# Patient Record
Sex: Female | Born: 1980 | Race: Black or African American | Hispanic: No | Marital: Single | Smoking: Never smoker
Health system: Southern US, Community
[De-identification: ages and names within clinical notes are randomized; demographics above are authoritative.]

---

## 2004-09-05 ENCOUNTER — Inpatient Hospital Stay (HOSPITAL_COMMUNITY): Admission: AD | Admit: 2004-09-05 | Discharge: 2004-09-07 | Payer: Self-pay | Admitting: *Deleted

## 2004-09-05 ENCOUNTER — Ambulatory Visit: Payer: Self-pay | Admitting: Family Medicine

## 2004-09-29 ENCOUNTER — Ambulatory Visit (HOSPITAL_COMMUNITY): Admission: RE | Admit: 2004-09-29 | Discharge: 2004-09-29 | Payer: Self-pay | Admitting: *Deleted

## 2005-02-22 ENCOUNTER — Inpatient Hospital Stay (HOSPITAL_COMMUNITY): Admission: AD | Admit: 2005-02-22 | Discharge: 2005-02-22 | Payer: Self-pay | Admitting: Obstetrics

## 2005-02-26 ENCOUNTER — Inpatient Hospital Stay (HOSPITAL_COMMUNITY): Admission: AD | Admit: 2005-02-26 | Discharge: 2005-02-28 | Payer: Self-pay | Admitting: Internal Medicine

## 2005-05-01 ENCOUNTER — Emergency Department (HOSPITAL_COMMUNITY): Admission: EM | Admit: 2005-05-01 | Discharge: 2005-05-01 | Payer: Self-pay | Admitting: Family Medicine

## 2005-08-10 IMAGING — US US RETROPERITONEAL COMPLETE
1 series · 18 of 25 positions shown · non-contrast
Comparison: None.

CLINICAL DATA: Fourteen and one/half weeks pregnant patient complains of abdominal pain.  Evaluate for renal stones.
 RENAL SONOGRAM:

[Series 1: us renal/aorta · 18 of 32 slices shown]
[im 1/32]
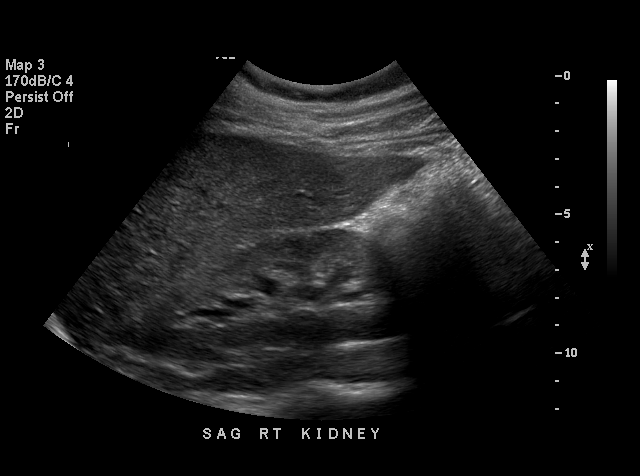
[im 3/32]
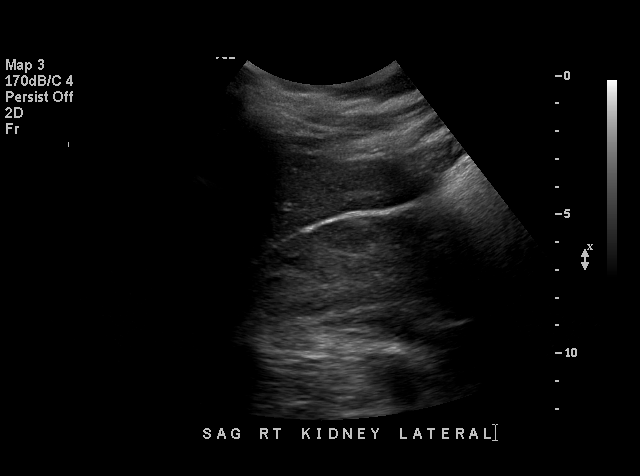
[im 4/32]
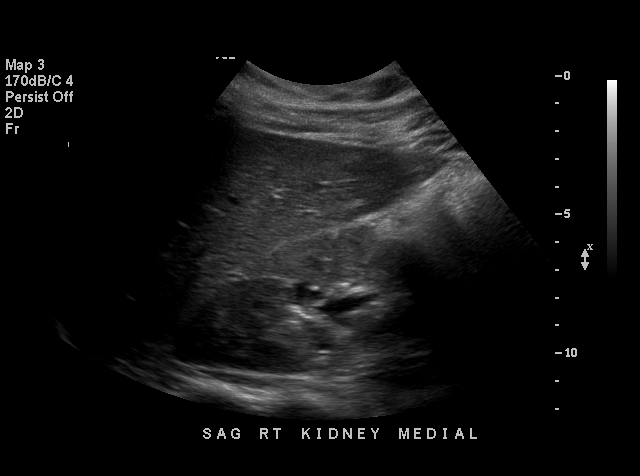
[im 6/32]
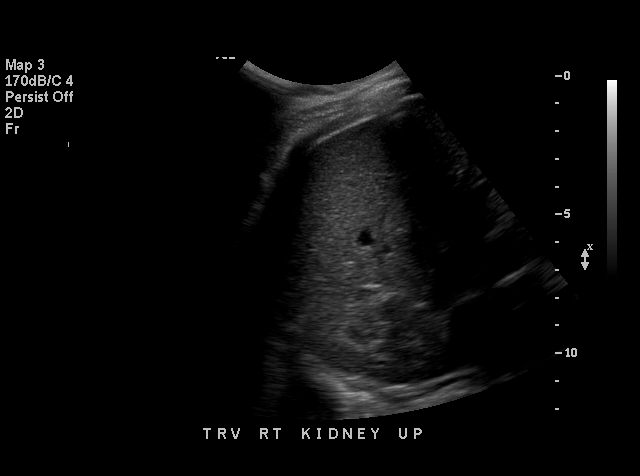
[im 8/32]
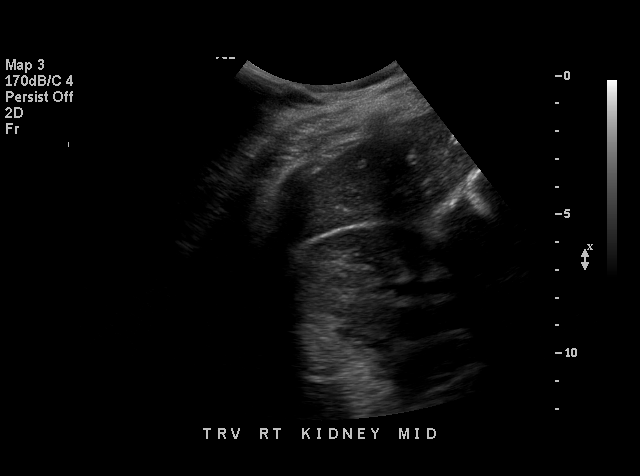
[im 10/32]
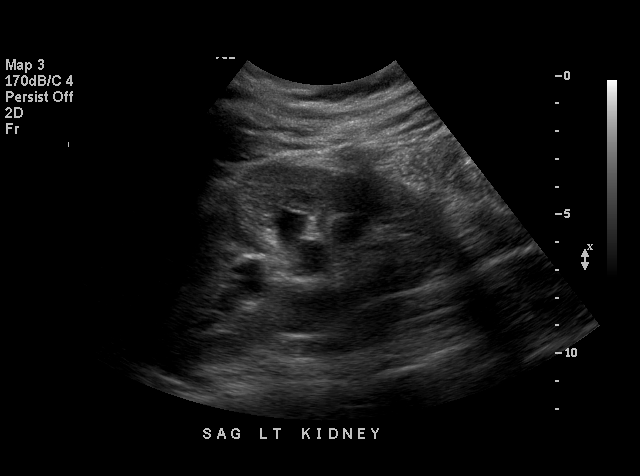
[im 12/32]
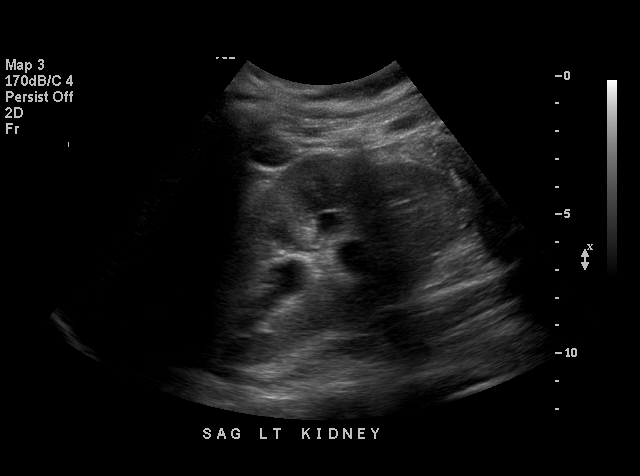
[im 13/32]
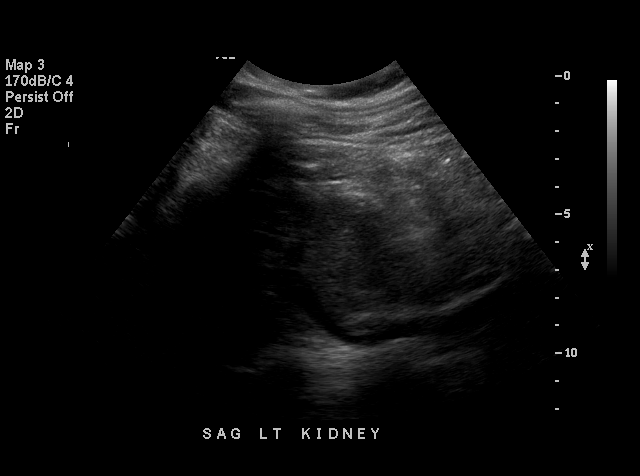
[im 15/32]
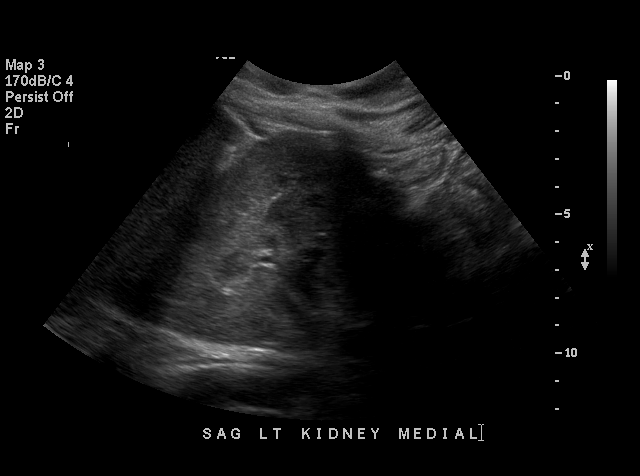
[im 17/32]
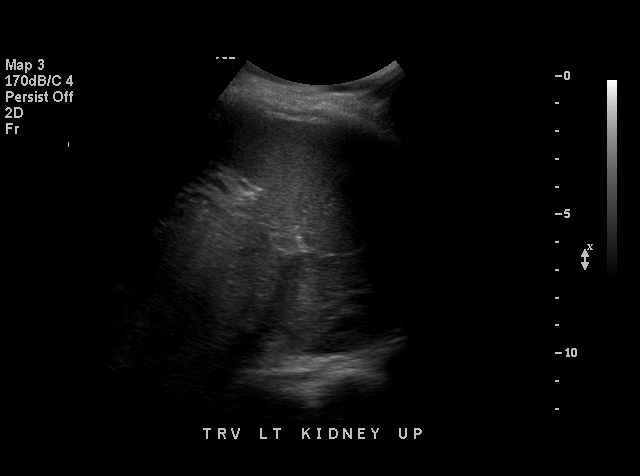
[im 19/32]
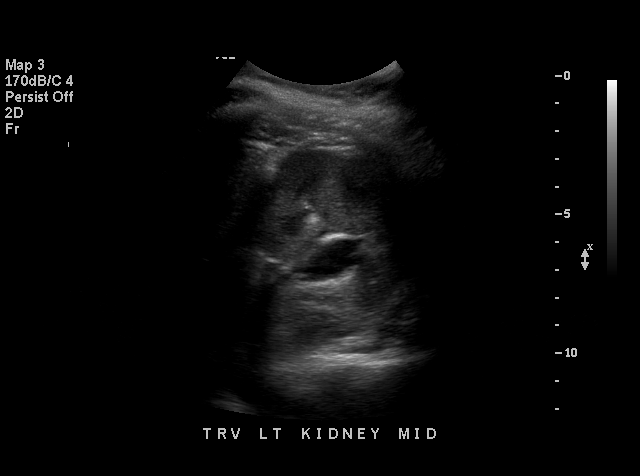
[im 20/32]
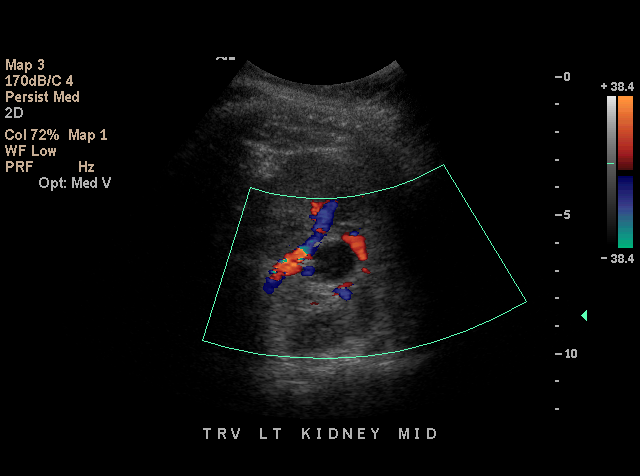
[im 22/32]
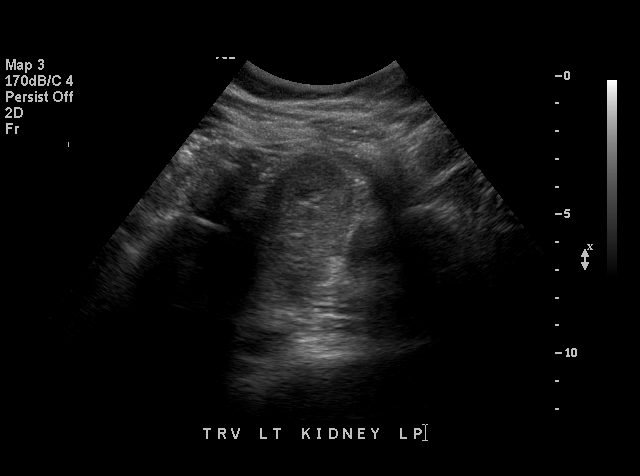
[im 24/32]
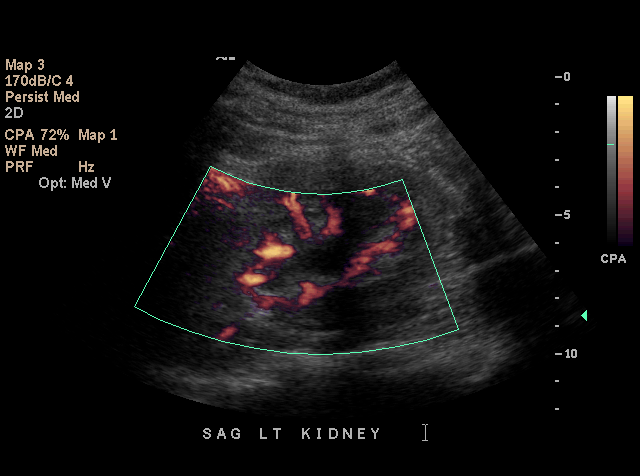
[im 26/32]
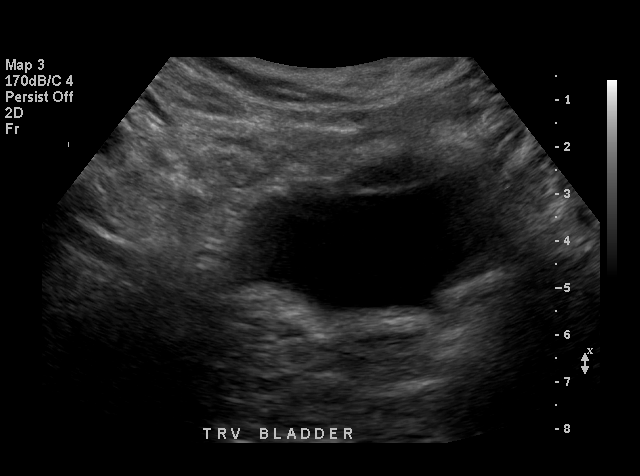
[im 28/32]
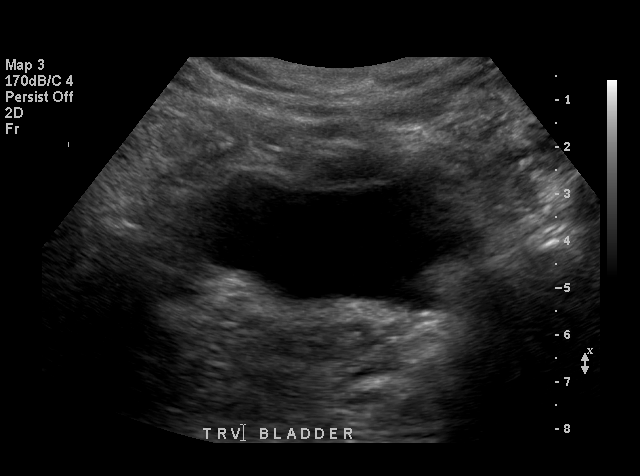
[im 29/32]
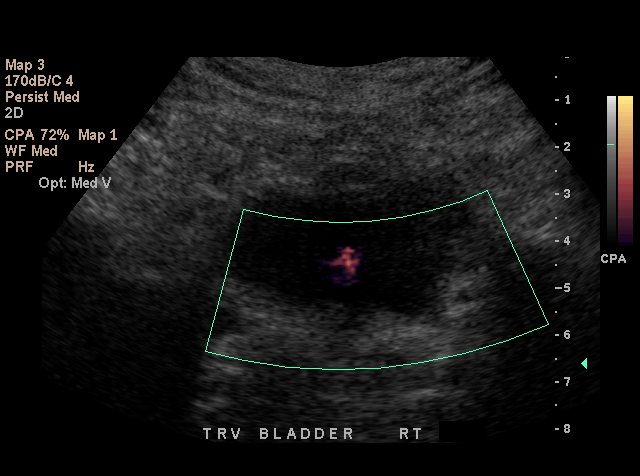
[im 32/32]
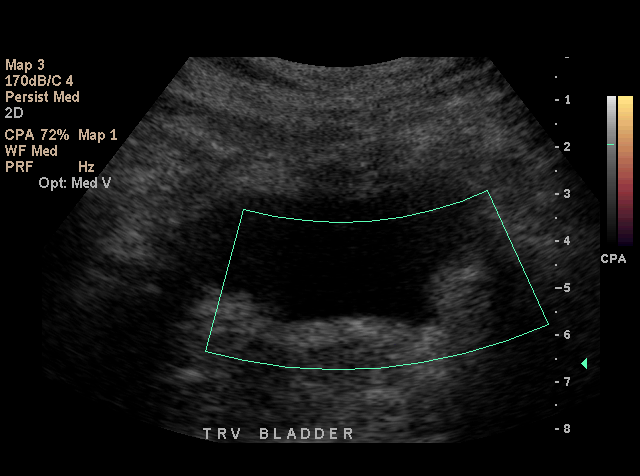

[18 of 25 positions shown; findings below may reference images not displayed]

FINDINGS: The right kidney is normal in size and echogenicity measuring 10.53 cm in long axis.  There is no evidence for nephrolithiasis or hydronephrosis.
 The left kidney is also normal in size and echogenicity.  The long axis measures 11.4 cm.  No evidence for nephrolithiasis or hydronephrosis.  
 The bladder is normal in appearance.  The right ureteral jet is identified.  The left ureteral jet was not visualized.
IMPRESSION: Normal renal sonogram without evidence for hydronephrosis.

## 2005-08-11 IMAGING — US US OB COMP +14 WK
1 series · 13 of 28 positions shown · non-contrast
Comparison: none

CLINICAL DATA: Dating.  Unsure gestational age.

[Series 1: us ob comp +14 wk · 0.24mm/px · 13 of 31 slices shown]
[im 2/31]
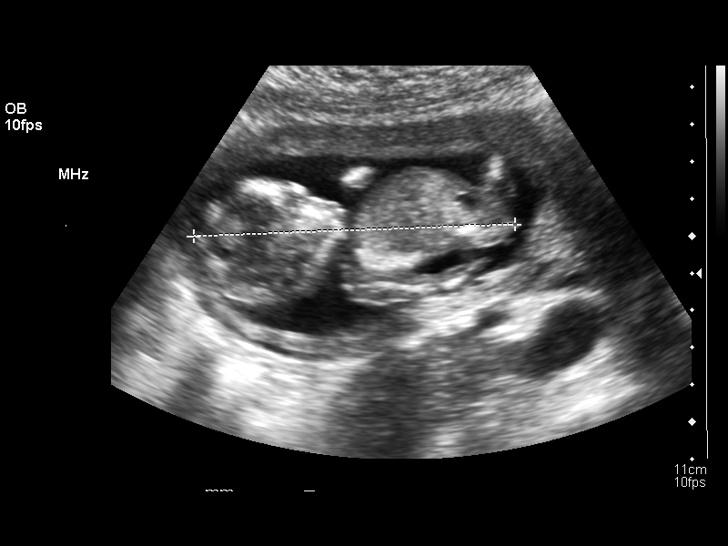
[im 4/31]
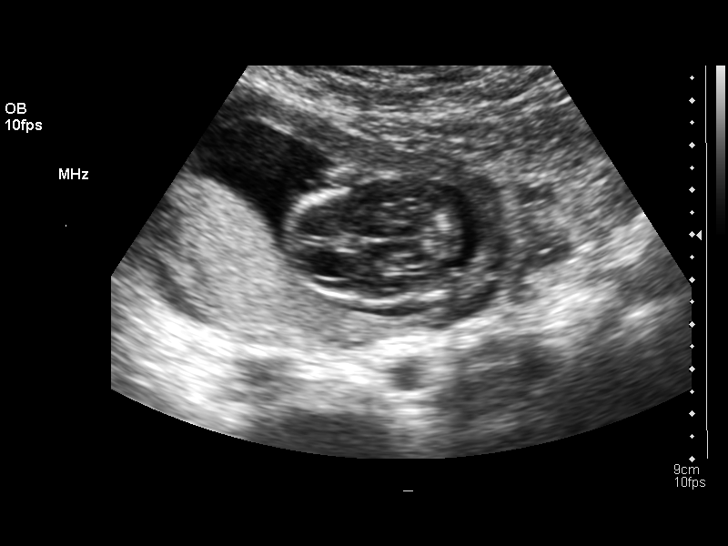
[im 6/31]
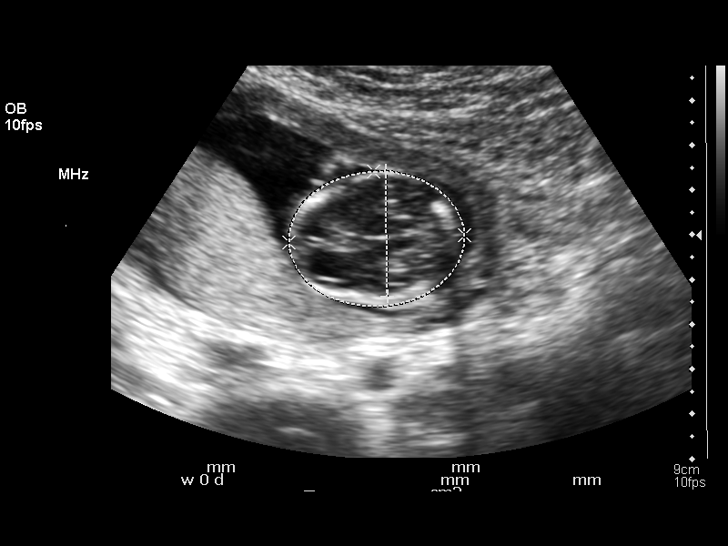
[im 8/31]
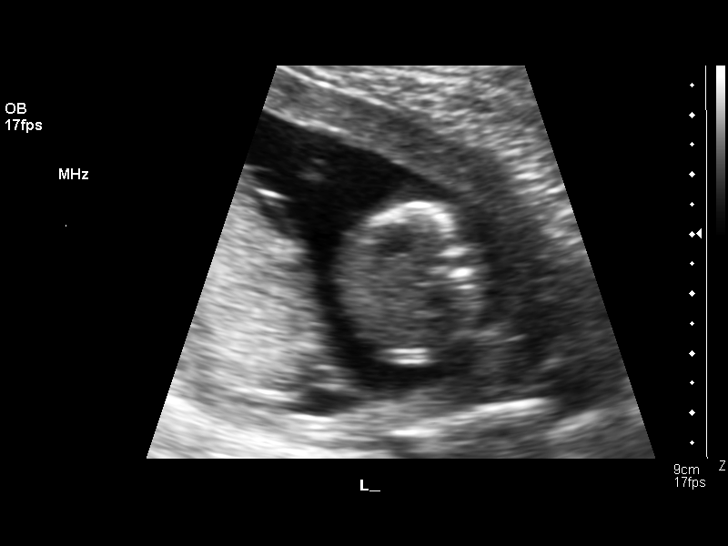
[im 11/31]
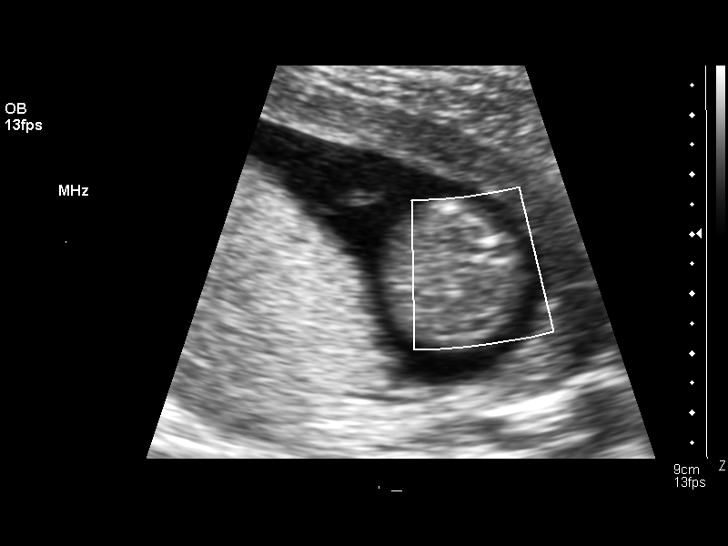
[im 13/31]
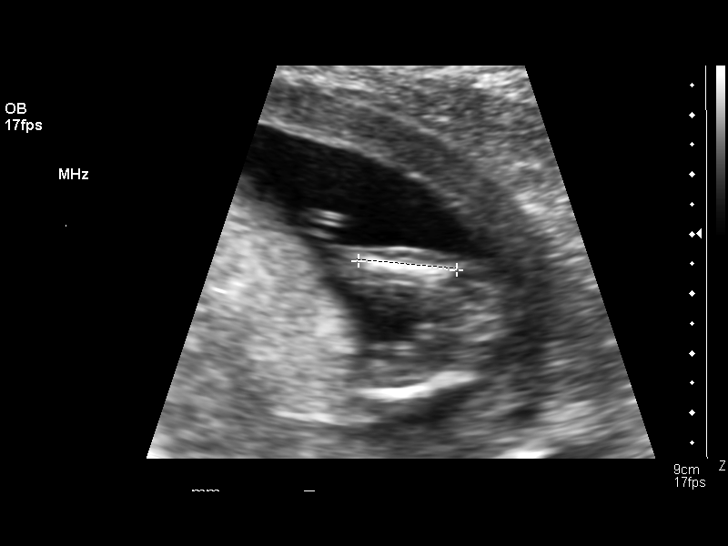
[im 16/31]
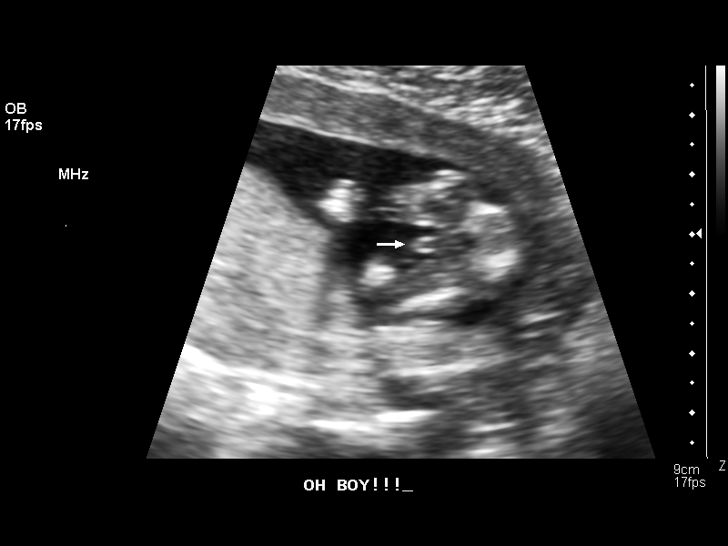
[im 18/31]
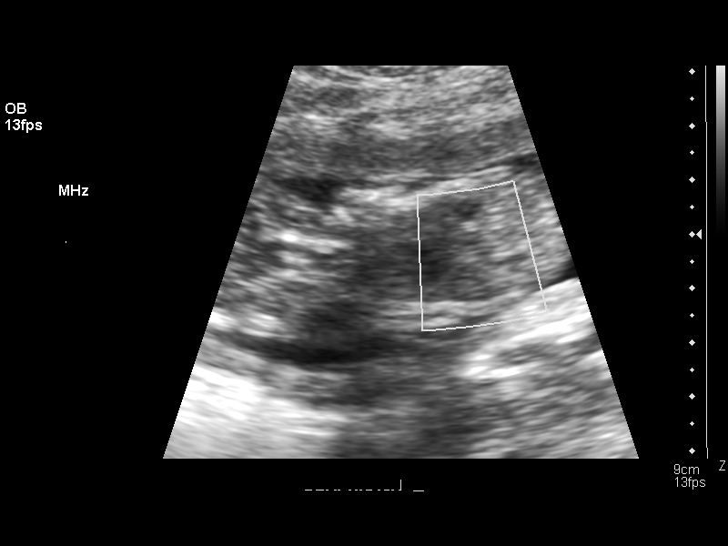
[im 21/31]
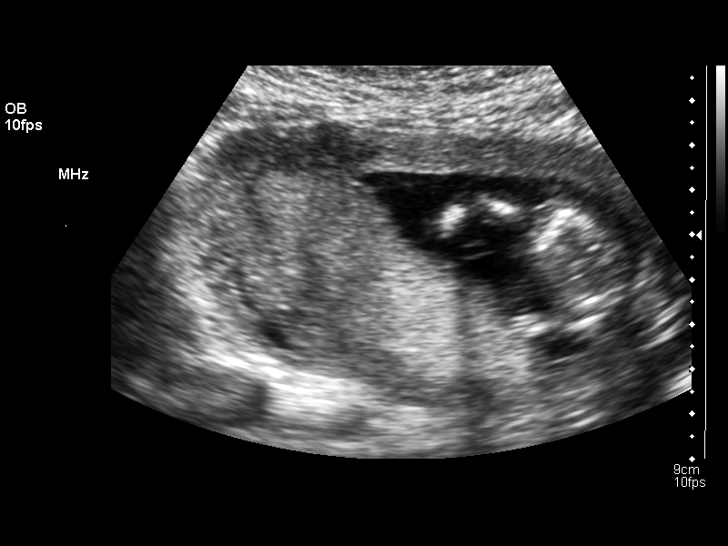
[im 23/31]
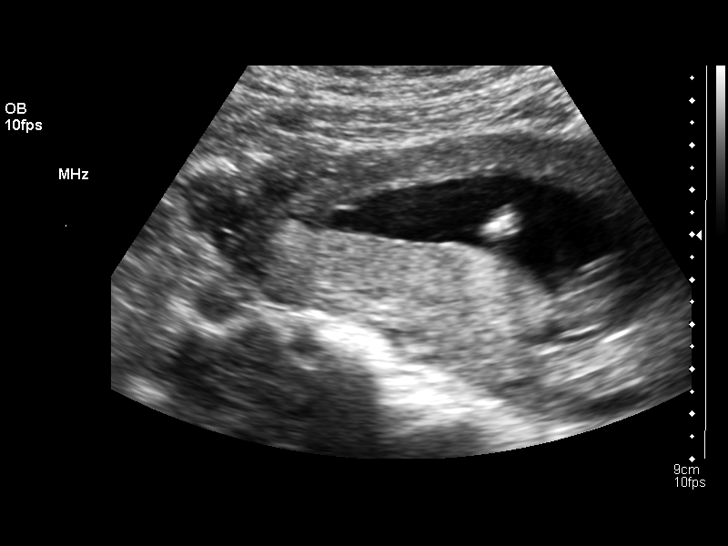
[im 25/31]
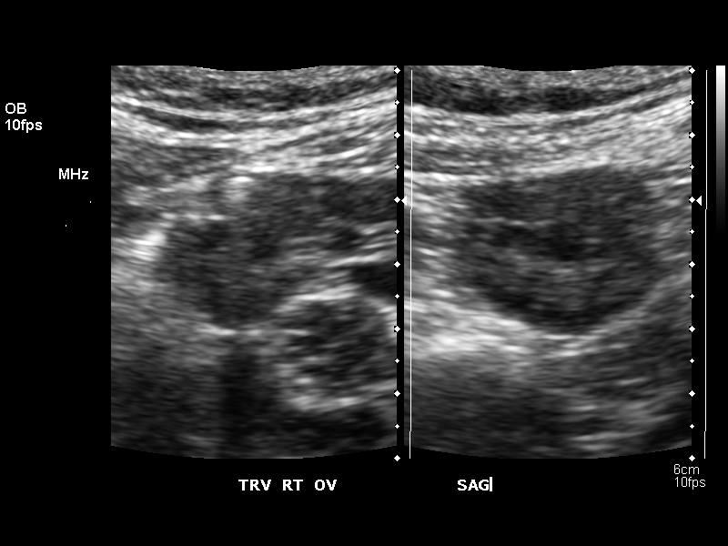
[im 27/31]
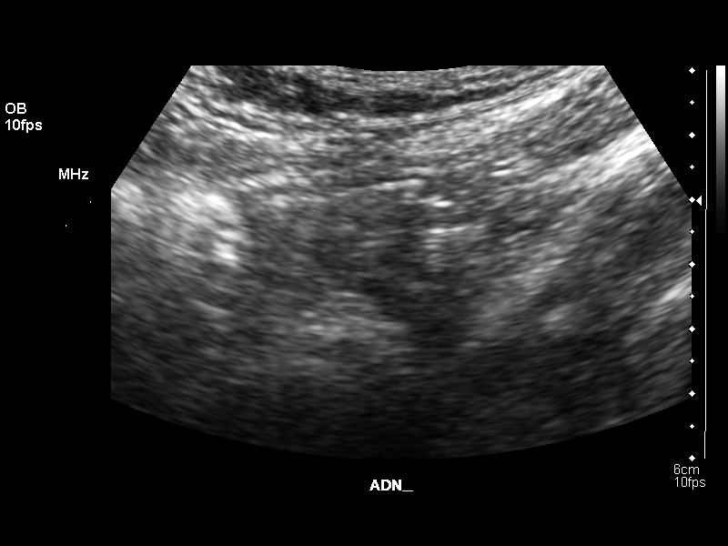
[im 29/31]
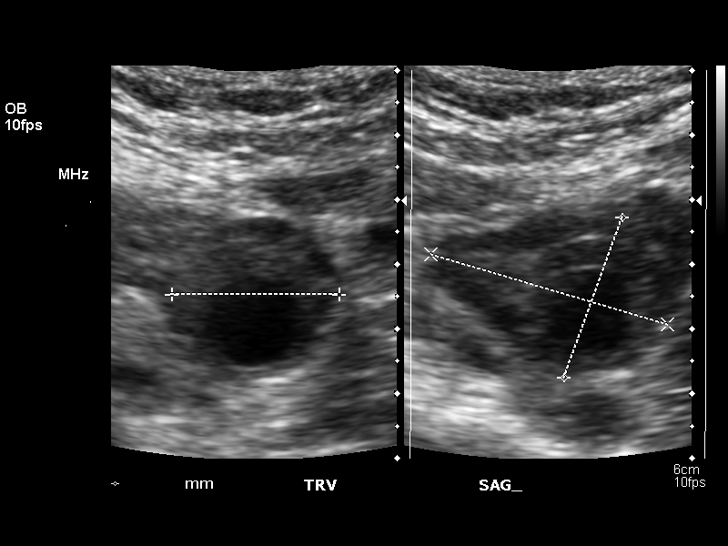

[13 of 28 positions shown; findings below may reference images not displayed]

OBSTETRICAL ULTRASOUND:
 Number of Fetuses:  1
 Heart Rate:  136
 Movement:  Yes
 Breathing:    No  
 Presentation:  Transverse
 Placental Location:  Posterior
 Grade:  0
 Previa:  No
 Amniotic Fluid (Subjective):  Normal
 Amniotic Fluid (Objective):   2.9 cm Vertical pocket 

 FETAL BIOMETRY
 BPD:   2.8 cm  15 w 0 d
 HC:   10.7 cm  15 w 1 d
 AC:   8.6 cm  14 w 6 d
 FL:   1.7 cm  15 w 0 d

 MEAN GA:  14 w 6 d

 FETAL ANATOMY
 Lateral Ventricles:    Not visualized (CP visualized)
 Thalami/CSP:      Visualized 
 Posterior Fossa:  Visualized   
 Nuchal Region:    Not visualized 
 Spine:      Not visualized 
 4 Chamber Heart on Left:      Not visualized 
 Stomach on Left:      Visualized 
 3 Vessel Cord:    Visualized 
 Cord Insertion site:    Not visualized 
 Kidneys:  Visualized 
 Bladder:  Visualized 
 Extremities:      Not visualized 

 ADDITIONAL ANATOMY VISUALIZED:  Diaphragm and male genitalia.

 Evaluation limited by:  Early gestational age 

 MATERNAL UTERINE AND ADNEXAL FINDINGS
 Cervix:   Not evaluated (< 16 weeks).  
 Left ovarian corpus luteum cyst.  Normal right ovary.  No free fluid.
IMPRESSION: Single living intrauterine gestation currently in transverse lie with a mean gestational age by ultrasound of 14 weeks 6 days.  Limited anatomic evaluation due to early gestational age.

 </u12:p>

## 2006-02-26 ENCOUNTER — Emergency Department (HOSPITAL_COMMUNITY): Admission: AD | Admit: 2006-02-26 | Discharge: 2006-02-26 | Payer: Self-pay | Admitting: Emergency Medicine

## 2010-08-02 ENCOUNTER — Encounter: Payer: Self-pay | Admitting: Obstetrics & Gynecology

## 2013-01-10 ENCOUNTER — Encounter: Payer: Self-pay | Admitting: Obstetrics & Gynecology

## 2013-01-10 ENCOUNTER — Ambulatory Visit (INDEPENDENT_AMBULATORY_CARE_PROVIDER_SITE_OTHER): Payer: Managed Care, Other (non HMO) | Admitting: Obstetrics & Gynecology

## 2013-01-10 VITALS — BP 115/78 | HR 72 | Temp 98.6°F | Ht 62.0 in | Wt 138.5 lb

## 2013-01-10 DIAGNOSIS — Z01419 Encounter for gynecological examination (general) (routine) without abnormal findings: Secondary | ICD-10-CM | POA: Insufficient documentation

## 2013-01-10 LAB — HIV ANTIBODY (ROUTINE TESTING W REFLEX): HIV: NONREACTIVE

## 2013-01-10 MED ORDER — METRONIDAZOLE 500 MG PO TABS: 500.0000 mg | ORAL_TABLET | Freq: Two times a day (BID) | ORAL | Status: DC

## 2013-01-10 NOTE — Progress Notes (Signed)
.   Subjective:     Mary Huff is a 32 y.o. female here for a routine exam.  She would like all STD testing done, including blood work.  No current complaints.  Personal health questionnaire reviewed: no.   Gynecologic History No LMP recorded. Patient is not currently having periods (Reason: IUD). Contraception: IUD Last Pap: 03/2011. Results were: normal Last mammogram: N/A  Obstetric History OB History   Grav Para Term Preterm Abortions TAB SAB Ect Mult Living                   The following portions of the patient's history were reviewed and updated as appropriate: allergies, current medications, past family history, past medical history, past social history, past surgical history and problem list.  Review of Systems Pertinent items are noted in HPI.    Objective:    General appearance: alert Breasts: normal appearance, no masses or tenderness Abdomen: soft, non-tender; bowel sounds normal; no masses,  no organomegaly Pelvic: cervix normal in appearance, IUD strings present, external genitalia normal, no adnexal masses or tenderness, uterus normal size, shape, and consistency and vagina normal with thin, white discharge    Assessment:    Healthy female exam.  Likely bacterial vaginosis   Plan:    Metronidazole Return prn

## 2013-01-10 NOTE — Patient Instructions (Addendum)
Vaginitis Vaginitis is an inflammation of the vagina. It is most often caused by a change in the normal balance of the bacteria and yeast that live in the vagina. This change in balance causes an overgrowth of certain bacteria or yeast, which causes the inflammation. There are different types of vaginitis, but the most common types are:  Bacterial vaginosis.  Yeast infection (candidiasis).  Trichomoniasis vaginitis. This is a sexually transmitted infection (STI).  Viral vaginitis.  Atropic vaginitis.  Allergic vaginitis. CAUSES  The cause depends on the type of vaginitis. Vaginitis can be caused by:  Bacteria (bacterial vaginosis).  Yeast (yeast infection).  A parasite (trichomoniasis vaginitis)  A virus (viral vaginitis).  Low hormone levels (atrophic vaginitis). Low hormone levels can occur during pregnancy, breastfeeding, or after menopause.  Irritants, such as bubble baths, scented tampons, and feminine sprays (allergic vaginitis). Other factors can change the normal balance of the yeast and bacteria that live in the vagina. These include:  Antibiotic medicines.  Poor hygiene.  Diaphragms, vaginal sponges, spermicides, birth control pills, and intrauterine devices (IUD).  Sexual intercourse.  Infection.  Uncontrolled diabetes.  A weakened immune system. SYMPTOMS  Symptoms can vary depending on the cause of the vaginitis. Common symptoms include:  Abnormal vaginal discharge.  The discharge is white, gray, or yellow with bacterial vaginosis.  The discharge is thick, white, and cheesy with a yeast infection.  The discharge is frothy and yellow or greenish with trichomoniasis.  A bad vaginal odor.  The odor is fishy with bacterial vaginosis.  Vaginal itching, pain, or swelling.  Painful intercourse.  Pain or burning when urinating. Sometimes, there are no symptoms. TREATMENT  Treatment will vary depending on the type of infection.   Bacterial  vaginosis and trichomoniasis are often treated with antibiotic creams or pills.  Yeast infections are often treated with antifungal medicines, such as vaginal creams or suppositories.  Viral vaginitis has no cure, but symptoms can be treated with medicines that relieve discomfort. Your sexual partner should be treated as well.  Atrophic vaginitis may be treated with an estrogen cream, pill, suppository, or vaginal ring. If vaginal dryness occurs, lubricants and moisturizing creams may help. You may be told to avoid scented soaps, sprays, or douches.  Allergic vaginitis treatment involves quitting the use of the product that is causing the problem. Vaginal creams can be used to treat the symptoms. HOME CARE INSTRUCTIONS   Take all medicines as directed by your caregiver.  Keep your genital area clean and dry. Avoid soap and only rinse the area with water.  Avoid douching. It can remove the healthy bacteria in the vagina.  Do not use tampons or have sexual intercourse until your vaginitis has been treated. Use sanitary pads while you have vaginitis.  Wipe from front to back. This avoids the spread of bacteria from the rectum to the vagina.  Let air reach your genital area.  Wear cotton underwear to decrease moisture buildup.  Avoid wearing underwear while you sleep until your vaginitis is gone.  Avoid tight pants and underwear or nylons without a cotton panel.  Take off wet clothing (especially bathing suits) as soon as possible.  Use mild, non-scented products. Avoid using irritants, such as:  Scented feminine sprays.  Fabric softeners.  Scented detergents.  Scented tampons.  Scented soaps or bubble baths.  Practice safe sex and use condoms. Condoms may prevent the spread of trichomoniasis and viral vaginitis. SEEK MEDICAL CARE IF:   You have abdominal pain.  You   have a fever or persistent symptoms for more than 2 3 days.  You have a fever and your symptoms suddenly  get worse. Document Released: 04/25/2007 Document Revised: 03/22/2012 Document Reviewed: 12/09/2011 Exit[employer] Patient Information 2014 [employer]. Health Maintenance, Females A healthy lifestyle and preventative care can promote health and wellness.  Maintain regular health, dental, and eye exams.  Eat a healthy diet. Foods like vegetables, fruits, whole grains, low-fat dairy products, and lean protein foods contain the nutrients you need without too many calories. Decrease your intake of foods high in solid fats, added sugars, and salt. Get information about a proper diet from your caregiver, if necessary.  Regular physical exercise is one of the most important things you can do for your health. Most adults should get at least 150 minutes of moderate-intensity exercise (any activity that increases your heart rate and causes you to sweat) each week. In addition, most adults need muscle-strengthening exercises on 2 or more days a week.   Maintain a healthy weight. The body mass index (BMI) is a screening tool to identify possible weight problems. It provides an estimate of body fat based on height and weight. Your caregiver can help determine your BMI, and can help you achieve or maintain a healthy weight. For adults 20 years and older:  A BMI below 18.5 is considered underweight.  A BMI of 18.5 to 24.9 is normal.  A BMI of 25 to 29.9 is considered overweight.  A BMI of 30 and above is considered obese.  Maintain normal blood lipids and cholesterol by exercising and minimizing your intake of saturated fat. Eat a balanced diet with plenty of fruits and vegetables. Blood tests for lipids and cholesterol should begin at age 20 and be repeated every 5 years. If your lipid or cholesterol levels are high, you are over 50, or you are a high risk for heart disease, you may need your cholesterol levels checked more frequently.Ongoing high lipid and cholesterol levels should be treated with  medicines if diet and exercise are not effective.  If you smoke, find out from your caregiver how to quit. If you do not use tobacco, do not start.  If you are pregnant, do not drink alcohol. If you are breastfeeding, be very cautious about drinking alcohol. If you are not pregnant and choose to drink alcohol, do not exceed 1 drink per day. One drink is considered to be 12 ounces (355 mL) of beer, 5 ounces (148 mL) of wine, or 1.5 ounces (44 mL) of liquor.  Avoid use of street drugs. Do not share needles with anyone. Ask for help if you need support or instructions about stopping the use of drugs.  High blood pressure causes heart disease and increases the risk of stroke. Blood pressure should be checked at least every 1 to 2 years. Ongoing high blood pressure should be treated with medicines, if weight loss and exercise are not effective.  If you are 55 to 34 years old, ask your caregiver if you should take aspirin to prevent strokes.  Diabetes screening involves taking a blood sample to check your fasting blood sugar level. This should be done once every 3 years, after age 45, if you are within normal weight and without risk factors for diabetes. Testing should be considered at a younger age or be carried out more frequently if you are overweight and have at least 1 risk factor for diabetes.  Breast cancer screening is essential preventative care for women. You should practice "  breast self-awareness." This means understanding the normal appearance and feel of your breasts and may include breast self-examination. Any changes detected, no matter how small, should be reported to a caregiver. Women in their 20s and 30s should have a clinical breast exam (CBE) by a caregiver as part of a regular health exam every 1 to 3 years. After age 40, women should have a CBE every year. Starting at age 40, women should consider having a mammogram (breast X-ray) every year. Women who have a family history of breast  cancer should talk to their caregiver about genetic screening. Women at a high risk of breast cancer should talk to their caregiver about having an MRI and a mammogram every year.  The Pap test is a screening test for cervical cancer. Women should have a Pap test starting at age 21. Between ages 21 and 29, Pap tests should be repeated every 2 years. Beginning at age 30, you should have a Pap test every 3 years as long as the past 3 Pap tests have been normal. If you had a hysterectomy for a problem that was not cancer or a condition that could lead to cancer, then you no longer need Pap tests. If you are between ages 65 and 70, and you have had normal Pap tests going back 10 years, you no longer need Pap tests. If you have had past treatment for cervical cancer or a condition that could lead to cancer, you need Pap tests and screening for cancer for at least 20 years after your treatment. If Pap tests have been discontinued, risk factors (such as a new sexual partner) need to be reassessed to determine if screening should be resumed. Some women have medical problems that increase the chance of getting cervical cancer. In these cases, your caregiver may recommend more frequent screening and Pap tests.  The human papillomavirus (HPV) test is an additional test that may be used for cervical cancer screening. The HPV test looks for the virus that can cause the cell changes on the cervix. The cells collected during the Pap test can be tested for HPV. The HPV test could be used to screen women aged 30 years and older, and should be used in women of any age who have unclear Pap test results. After the age of 30, women should have HPV testing at the same frequency as a Pap test.  Colorectal cancer can be detected and often prevented. Most routine colorectal cancer screening begins at the age of 50 and continues through age 75. However, your caregiver may recommend screening at an earlier age if you have risk factors  for colon cancer. On a yearly basis, your caregiver may provide home test kits to check for hidden blood in the stool. Use of a small camera at the end of a tube, to directly examine the colon (sigmoidoscopy or colonoscopy), can detect the earliest forms of colorectal cancer. Talk to your caregiver about this at age 50, when routine screening begins. Direct examination of the colon should be repeated every 5 to 10 years through age 75, unless early forms of pre-cancerous polyps or small growths are found.  Hepatitis C blood testing is recommended for all people born from 1945 through 1965 and any individual with known risks for hepatitis C.  Practice safe sex. Use condoms and avoid high-risk sexual practices to reduce the spread of sexually transmitted infections (STIs). Sexually active women aged 25 and younger should be checked for Chlamydia, which is a common   sexually transmitted infection. Older women with new or multiple partners should also be tested for Chlamydia. Testing for other STIs is recommended if you are sexually active and at increased risk.  Osteoporosis is a disease in which the bones lose minerals and strength with aging. This can result in serious bone fractures. The risk of osteoporosis can be identified using a bone density scan. Women ages 65 and over and women at risk for fractures or osteoporosis should discuss screening with their caregivers. Ask your caregiver whether you should be taking a calcium supplement or vitamin D to reduce the rate of osteoporosis.  Menopause can be associated with physical symptoms and risks. Hormone replacement therapy is available to decrease symptoms and risks. You should talk to your caregiver about whether hormone replacement therapy is right for you.  Use sunscreen with a sun protection factor (SPF) of 30 or greater. Apply sunscreen liberally and repeatedly throughout the day. You should seek shade when your shadow is shorter than you. Protect  yourself by wearing long sleeves, pants, a wide-brimmed hat, and sunglasses year round, whenever you are outdoors.  Notify your caregiver of new moles or changes in moles, especially if there is a change in shape or color. Also notify your caregiver if a mole is larger than the size of a pencil eraser.  Stay current with your immunizations. Document Released: 01/11/2011 Document Revised: 09/20/2011 Document Reviewed: 01/11/2011 [employer] Patient Information 2014 [employer].  

## 2013-01-11 LAB — RPR

## 2013-01-15 LAB — PAP IG, CT-NG NAA, HPV HIGH-RISK
Chlamydia Probe Amp: NEGATIVE
HPV DNA High Risk: NOT DETECTED

## 2014-01-10 ENCOUNTER — Ambulatory Visit: Payer: Managed Care, Other (non HMO) | Admitting: Obstetrics & Gynecology

## 2014-01-10 ENCOUNTER — Encounter: Payer: Self-pay | Admitting: Obstetrics & Gynecology

## 2014-01-10 ENCOUNTER — Ambulatory Visit (INDEPENDENT_AMBULATORY_CARE_PROVIDER_SITE_OTHER): Payer: Managed Care, Other (non HMO) | Admitting: Obstetrics & Gynecology

## 2014-01-10 VITALS — BP 108/68 | HR 65 | Temp 98.6°F | Ht 62.0 in | Wt 147.0 lb

## 2014-01-10 DIAGNOSIS — Z01419 Encounter for gynecological examination (general) (routine) without abnormal findings: Secondary | ICD-10-CM

## 2014-01-10 NOTE — Progress Notes (Signed)
Subjective:     Mary Huff is a 33 y.o. female here for a routine exam.  Current complaints: excessive vaginal discharge.    Personal health questionnaire:  Is patient [name], have a family history of breast and/or ovarian cancer: no Is there a family history of uterine cancer diagnosed at age < 7350, gastrointestinal cancer, urinary tract cancer, family member who is a Personnel officerLynch syndrome-associated carrier: no Is the patient overweight and hypertensive, family history of diabetes, personal history of gestational diabetes or PCOS: yes Is patient over 3255, have PCOS,  family history of premature CHD under age 35, diabetes, smoke, have hypertension or peripheral artery disease:  no   Gynecologic History Patient's last menstrual period was 01/02/2014. Contraception: IUD Last Pap: 2014. Results were: normal   Obstetric History OB History  Gravida Para Term Preterm AB SAB TAB Ectopic Multiple Living  3 3 3            # Outcome Date GA Lbr Len/2nd Weight Sex Delivery Anes PTL Lv  3 TRM           2 TRM           1 TRM               History reviewed. No pertinent past medical history.  History reviewed. No pertinent past surgical history.  Current outpatient prescriptions:levonorgestrel (MIRENA) 20 MCG/24HR IUD, 1 each by Intrauterine route once., Disp: , Rfl:  Allergies  Allergen Reactions  . Sulfa Antibiotics     History  Substance Use Topics  . Smoking status: Never Smoker   . Smokeless tobacco: Never Used  . Alcohol Use: No    Family History  Problem Relation Age of Onset  . Diabetes Father       Review of Systems  Constitutional: negative for fatigue and weight loss Respiratory: negative for cough and wheezing Cardiovascular: negative for chest pain, fatigue and palpitations Gastrointestinal: negative for abdominal pain and change in bowel habits Musculoskeletal:negative for myalgias Neurological: negative for gait problems and tremors Behavioral/Psych:  negative for abusive relationship, depression Endocrine: negative for temperature intolerance   Genitourinary:negative for abnormal menstrual periods, genital lesions, hot flashes, sexual problems; positive for increased vaginal discharge Integument/breast: negative for breast lump, breast tenderness, nipple discharge and skin lesion(s)    Objective:       BP 108/68  Pulse 65  Temp(Src) 98.6 F (37 C)  Ht 5\' 2"  (1.575 m)  Wt 66.679 kg (147 lb)  BMI 26.88 kg/m2  LMP 01/02/2014 General:   alert  Skin:   no rash or abnormalities  Lungs:   clear to auscultation bilaterally  Heart:   regular rate and rhythm, S1, S2 normal, no murmur, click, rub or gallop  Breasts:   normal without suspicious masses, skin or nipple changes or axillary nodes  Abdomen:  normal findings: no organomegaly, soft, non-tender and no hernia  Pelvis:  External genitalia: normal general appearance Urinary system: urethral meatus normal and bladder without fullness, nontender Vaginal: normal without tenderness, induration or masses Cervix: normal appearance; IUD strings seen Adnexa: normal bimanual exam Uterus: anteverted and non-tender, normal size   Lab Review  Labs reviewed yes Radiologic studies reviewed no    Assessment:    Healthy female exam.    Plan:    Education reviewed: low fat, low cholesterol diet, safe sex/STD prevention and weight bearing exercise.  Wet prep by molecular probe/GC & CT probe/HIV/RPR Follow up as needed.

## 2014-01-10 NOTE — Patient Instructions (Signed)

## 2014-01-10 NOTE — Addendum Note (Signed)
Addended by: Odessa FlemingBOHNE, Raekwon Winkowski M on: 01/10/2014 04:29 PM   Modules accepted: Orders

## 2014-01-11 LAB — RPR

## 2014-01-11 LAB — HIV ANTIBODY (ROUTINE TESTING W REFLEX): HIV 1&2 Ab, 4th Generation: NONREACTIVE

## 2014-01-11 LAB — WET PREP BY MOLECULAR PROBE
CANDIDA SPECIES: NEGATIVE
GARDNERELLA VAGINALIS: POSITIVE — AB
TRICHOMONAS VAG: NEGATIVE

## 2014-01-12 LAB — GC/CHLAMYDIA PROBE AMP
CT Probe RNA: NEGATIVE
GC Probe RNA: NEGATIVE

## 2014-01-14 ENCOUNTER — Ambulatory Visit: Payer: Managed Care, Other (non HMO) | Admitting: Obstetrics & Gynecology

## 2014-02-20 ENCOUNTER — Other Ambulatory Visit: Payer: Self-pay | Admitting: *Deleted

## 2014-02-20 DIAGNOSIS — N760 Acute vaginitis: Principal | ICD-10-CM

## 2014-02-20 DIAGNOSIS — B9689 Other specified bacterial agents as the cause of diseases classified elsewhere: Secondary | ICD-10-CM

## 2014-02-20 MED ORDER — METRONIDAZOLE 500 MG PO TABS: 500.0000 mg | ORAL_TABLET | Freq: Two times a day (BID) | ORAL | Status: DC

## 2014-05-13 ENCOUNTER — Encounter: Payer: Self-pay | Admitting: Obstetrics & Gynecology

## 2014-07-08 ENCOUNTER — Encounter: Payer: Self-pay | Admitting: *Deleted

## 2014-07-09 ENCOUNTER — Encounter: Payer: Self-pay | Admitting: Obstetrics & Gynecology

## 2014-07-24 ENCOUNTER — Telehealth: Payer: Self-pay | Admitting: *Deleted

## 2014-07-24 ENCOUNTER — Other Ambulatory Visit: Payer: Self-pay | Admitting: *Deleted

## 2014-07-24 DIAGNOSIS — B9689 Other specified bacterial agents as the cause of diseases classified elsewhere: Secondary | ICD-10-CM

## 2014-07-24 DIAGNOSIS — N760 Acute vaginitis: Principal | ICD-10-CM

## 2014-07-24 MED ORDER — METRONIDAZOLE 500 MG PO TABS: 500.0000 mg | ORAL_TABLET | Freq: Two times a day (BID) | ORAL | Status: AC

## 2014-07-24 NOTE — Telephone Encounter (Signed)
Patient [place] and had received our letter with her lab results. Patient Notified of what BV is and how you can get BV. Patient notified that Rx was sent to pharmacy the same day the letter was mailed. Patient states she never picked it up and asked if we could send it [place] the [facility] [place]. Patient notified that we could send it there for her and that she could pick it up today. Patient voiced understanding.

## 2014-10-28 ENCOUNTER — Telehealth: Payer: Self-pay | Admitting: *Deleted

## 2014-10-28 NOTE — Telephone Encounter (Signed)
Patient is interested in having her Mirena IUD removed and a new one inserted. Patient has an annual exam scheduled for 01-15-15. Unfortunately we are not able to do these on the same day.   Attempted to contact the patient and left message for patient to call the office.

## 2014-11-04 NOTE — Telephone Encounter (Signed)
Patient returned call. Attempted to return patient's call and left a message for the patient to contact the office.

## 2014-11-06 NOTE — Telephone Encounter (Signed)
Patient is trying to return call- but doesn't know who to ask for- LM on VM- needs to speak to Sue Lushndrea- extension given and instructions for front staff given also.

## 2014-11-11 NOTE — Telephone Encounter (Signed)
Patient has been advised that we are unable to do the annual exam and IUD removal/insertion on the same day. Patient states she will look at her schedule and contact the office to schedule her IUD appointment.

## 2015-01-15 ENCOUNTER — Ambulatory Visit: Payer: Managed Care, Other (non HMO) | Admitting: Obstetrics
